# Patient Record
Sex: Female | Born: 2000 | Race: White | Hispanic: No | Marital: Single | Smoking: Never smoker
Health system: Southern US, Community
[De-identification: ages and names within clinical notes are randomized; demographics above are authoritative.]

## PROBLEM LIST (undated history)

## (undated) VITALS — BP 94/65 | HR 128 | Temp 98.4°F | Resp 16 | Ht 60.63 in | Wt 92.4 lb

## (undated) DIAGNOSIS — R058 Other specified cough: Secondary | ICD-10-CM

## (undated) DIAGNOSIS — M791 Myalgia, unspecified site: Secondary | ICD-10-CM

## (undated) DIAGNOSIS — R634 Abnormal weight loss: Secondary | ICD-10-CM

## (undated) DIAGNOSIS — Z87438 Personal history of other diseases of male genital organs: Secondary | ICD-10-CM

## (undated) DIAGNOSIS — R05 Cough: Secondary | ICD-10-CM

## (undated) DIAGNOSIS — R233 Spontaneous ecchymoses: Secondary | ICD-10-CM

## (undated) DIAGNOSIS — F1990 Other psychoactive substance use, unspecified, uncomplicated: Secondary | ICD-10-CM

## (undated) DIAGNOSIS — F32A Depression, unspecified: Secondary | ICD-10-CM

## (undated) DIAGNOSIS — F909 Attention-deficit hyperactivity disorder, unspecified type: Secondary | ICD-10-CM

## (undated) DIAGNOSIS — L299 Pruritus, unspecified: Secondary | ICD-10-CM

## (undated) DIAGNOSIS — Z8742 Personal history of other diseases of the female genital tract: Secondary | ICD-10-CM

## (undated) DIAGNOSIS — R5383 Other fatigue: Secondary | ICD-10-CM

## (undated) DIAGNOSIS — M2550 Pain in unspecified joint: Secondary | ICD-10-CM

## (undated) DIAGNOSIS — R000 Tachycardia, unspecified: Secondary | ICD-10-CM

## (undated) DIAGNOSIS — R21 Rash and other nonspecific skin eruption: Secondary | ICD-10-CM

## (undated) DIAGNOSIS — R238 Other skin changes: Secondary | ICD-10-CM

## (undated) DIAGNOSIS — F419 Anxiety disorder, unspecified: Secondary | ICD-10-CM

## (undated) DIAGNOSIS — F329 Major depressive disorder, single episode, unspecified: Secondary | ICD-10-CM

## (undated) HISTORY — PX: NO PAST SURGERIES: SHX2092

## (undated) HISTORY — DX: Rash and other nonspecific skin eruption: R21

## (undated) HISTORY — DX: Other psychoactive substance use, unspecified, uncomplicated: F19.90

## (undated) HISTORY — DX: Other fatigue: R53.83

## (undated) HISTORY — DX: Tachycardia, unspecified: R00.0

## (undated) HISTORY — DX: Pruritus, unspecified: L29.9

## (undated) HISTORY — DX: Pain in unspecified joint: M25.50

## (undated) HISTORY — DX: Other skin changes: R23.8

## (undated) HISTORY — DX: Major depressive disorder, single episode, unspecified: F32.9

## (undated) HISTORY — DX: Depression, unspecified: F32.A

## (undated) HISTORY — DX: Spontaneous ecchymoses: R23.3

## (undated) HISTORY — DX: Personal history of other diseases of the female genital tract: Z87.438

## (undated) HISTORY — DX: Anxiety disorder, unspecified: F41.9

## (undated) HISTORY — DX: Myalgia, unspecified site: M79.10

## (undated) HISTORY — DX: Abnormal weight loss: R63.4

## (undated) HISTORY — DX: Attention-deficit hyperactivity disorder, unspecified type: F90.9

## (undated) HISTORY — DX: Other specified cough: R05.8

## (undated) HISTORY — DX: Cough: R05

## (undated) HISTORY — DX: Personal history of other diseases of the female genital tract: Z87.42

---
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
## 2019-01-14 DIAGNOSIS — B081 Molluscum contagiosum: Secondary | ICD-10-CM | POA: Diagnosis not present

## 2019-01-22 DIAGNOSIS — B081 Molluscum contagiosum: Secondary | ICD-10-CM | POA: Diagnosis not present

## 2019-02-16 ENCOUNTER — Encounter: Payer: Self-pay | Admitting: Physician Assistant

## 2019-02-17 ENCOUNTER — Encounter: Payer: Self-pay | Admitting: Physician Assistant

## 2019-02-18 ENCOUNTER — Telehealth (INDEPENDENT_AMBULATORY_CARE_PROVIDER_SITE_OTHER): Payer: BLUE CROSS/BLUE SHIELD | Admitting: Family Medicine

## 2019-02-18 ENCOUNTER — Ambulatory Visit: Payer: BLUE CROSS/BLUE SHIELD | Admitting: Physician Assistant

## 2019-02-18 ENCOUNTER — Encounter: Payer: Self-pay | Admitting: Family Medicine

## 2019-02-18 VITALS — Temp 98.7°F | Wt 105.9 lb

## 2019-02-18 DIAGNOSIS — N3000 Acute cystitis without hematuria: Secondary | ICD-10-CM

## 2019-02-18 MED ORDER — CEPHALEXIN 500 MG PO CAPS: 500.0000 mg | ORAL_CAPSULE | Freq: Two times a day (BID) | ORAL | 0 refills | Status: DC

## 2019-02-18 NOTE — Progress Notes (Signed)
Virtual Visit via Video Note  I connected with Sierra Mcdaniel on 02/18/19 at  1:40 PM EDT by a video enabled telemedicine application and verified that I am speaking with the correct person using two identifiers.   I discussed the limitations of evaluation and management by telemedicine and the availability of in person appointments. The patient expressed understanding and agreed to proceed.  Pt was at home and I was in my office for the virtual visit.     Subjective:    CC: Urinary sxs  HPI: Having some dysuria x 3-4 days. Now she is seeing blood. She has been trying to drink a ton of water. Getting intermittent pelvic pain/pressure. No fever, or chils or N/V. Some mild intermittent low back pain, worse on the right. No vaginal sxs like d/c or itching. No recent UTI. Taking AZO. No recent antibiotics.     Past medical history, Surgical history, Family history not pertinant except as noted below, Social history, Allergies, and medications have been entered into the medical record, reviewed, and corrections made.   Review of Systems: No fevers, chills, night sweats, weight loss, chest pain, or shortness of breath.   Objective:    General: Speaking clearly in complete sentences without any shortness of breath.  Alert and oriented x3.  Normal judgment. No apparent acute distress.    Impression and Recommendations:    UTI -is likely urinary tract infection based on current symptoms.  We will go ahead and treat with Keflex.  If not improving over the next couple days then please let us know we will have her come in and do a urinalysis and culture for further work-up at that point.  She denies any vaginal symptoms currently.  Make sure to stay hydrated.  Okay to use the Azo.  Call back if develops any new symptoms or fever.      I discussed the assessment and treatment plan with the patient. The patient was provided an opportunity to ask questions and all were answered. The patient agreed  with the plan and demonstrated an understanding of the instructions.   The patient was advised to call back or seek an in-person evaluation if the symptoms worsen or if the condition fails to improve as anticipated.   Beatrice Lecher, MD

## 2019-02-19 ENCOUNTER — Telehealth: Payer: BLUE CROSS/BLUE SHIELD | Admitting: Family Medicine

## 2019-03-01 DIAGNOSIS — Z20828 Contact with and (suspected) exposure to other viral communicable diseases: Secondary | ICD-10-CM | POA: Diagnosis not present

## 2019-04-28 DIAGNOSIS — B081 Molluscum contagiosum: Secondary | ICD-10-CM | POA: Diagnosis not present

## 2019-04-28 DIAGNOSIS — Z3040 Encounter for surveillance of contraceptives, unspecified: Secondary | ICD-10-CM | POA: Diagnosis not present

## 2019-05-12 DIAGNOSIS — R0981 Nasal congestion: Secondary | ICD-10-CM | POA: Diagnosis not present

## 2019-05-12 DIAGNOSIS — R05 Cough: Secondary | ICD-10-CM | POA: Diagnosis not present

## 2019-07-14 DIAGNOSIS — Z3041 Encounter for surveillance of contraceptive pills: Secondary | ICD-10-CM | POA: Diagnosis not present

## 2019-07-14 DIAGNOSIS — N9089 Other specified noninflammatory disorders of vulva and perineum: Secondary | ICD-10-CM | POA: Diagnosis not present

## 2019-07-14 DIAGNOSIS — Z681 Body mass index (BMI) 19 or less, adult: Secondary | ICD-10-CM | POA: Diagnosis not present

## 2019-07-14 DIAGNOSIS — Z113 Encounter for screening for infections with a predominantly sexual mode of transmission: Secondary | ICD-10-CM | POA: Diagnosis not present

## 2019-07-14 DIAGNOSIS — B081 Molluscum contagiosum: Secondary | ICD-10-CM | POA: Diagnosis not present

## 2019-07-14 DIAGNOSIS — Z01419 Encounter for gynecological examination (general) (routine) without abnormal findings: Secondary | ICD-10-CM | POA: Diagnosis not present

## 2019-07-21 ENCOUNTER — Ambulatory Visit (INDEPENDENT_AMBULATORY_CARE_PROVIDER_SITE_OTHER): Payer: BLUE CROSS/BLUE SHIELD | Admitting: Physician Assistant

## 2019-07-21 ENCOUNTER — Encounter: Payer: Self-pay | Admitting: Physician Assistant

## 2019-07-21 VITALS — BP 115/70 | HR 97 | Temp 98.3°F | Ht 60.5 in | Wt 107.0 lb

## 2019-07-21 DIAGNOSIS — F909 Attention-deficit hyperactivity disorder, unspecified type: Secondary | ICD-10-CM

## 2019-07-21 DIAGNOSIS — F3181 Bipolar II disorder: Secondary | ICD-10-CM

## 2019-07-21 DIAGNOSIS — F39 Unspecified mood [affective] disorder: Secondary | ICD-10-CM | POA: Diagnosis not present

## 2019-07-21 MED ORDER — OLANZAPINE-FLUOXETINE HCL 3-25 MG PO CAPS: 1.0000 | ORAL_CAPSULE | Freq: Every evening | ORAL | 1 refills | Status: DC

## 2019-07-21 NOTE — Progress Notes (Signed)
Wants to discuss mood. PHQ9-GAD7 completed.   

## 2019-07-21 NOTE — Progress Notes (Signed)
Patient ID: Sierra Mcdaniel, female   DOB: [birth year], 18 y.o.   MRN: [id number] .Marland KitchenVirtual Visit via Video Note  I connected with Sierra Mcdaniel on 07/21/19 at  2:20 PM EST by a video enabled telemedicine application and verified that I am speaking with the correct person using two identifiers.  Location: Patient: home Provider: clinic   I discussed the limitations of evaluation and management by telemedicine and the availability of in person appointments. The patient expressed understanding and agreed to proceed.  History of Present Illness: Patient is a 18 year old female with history of bipolar 2 disorder who calls into the clinic to discuss medications.  She has been off medications for little over a year.  Over the last few months she is noticing a significant mood decline.  She is feeling very down and depressed.  She denies any suicidal ideations or homicidal thoughts.  She did lose her job recently and almost in denial about not being able to find any work.  She feels a lot of life stress right now.  She is struggling with no motivation.  In the past years she has been on medication such as Lamictal, Zoloft, trazodone, Vyvanse.  She does want to start something.  She admits she is not exercising.  She has gone to counseling in the past.  She has gone to counseling since she was very very young.  It does not seem to really make a difference.  .. Active Ambulatory Problems    Diagnosis Date Noted  . ADHD (attention deficit hyperactivity disorder) 08/30/2011  . Mood disorder (HCC) 08/30/2011  . Bipolar 2 disorder (HCC) 03/12/2018  . Arthralgia 03/17/2018  . Cold intolerance 03/17/2018  . Vaginal itching 03/17/2018  . Neck mass 03/17/2018  . Dysuria 03/17/2018  . Hematuria 03/17/2018   Resolved Ambulatory Problems    Diagnosis Date Noted  . Vaginal discharge 03/17/2018   Past Medical History:  Diagnosis Date  . Anxiety   . Depression   . Depression   . Drug use   . Dry cough   .  Easy bruising   . Fatigue   . History of genital discharge as a child   . Itching   . Joint pain   . Muscle pain   . Racing heart beat   . Rash of genital area   . Unintended weight loss    Reviewed med, allergy, problem list.    Observations/Objective: No acute distress. Normal breathing.  Flat mood.   .. Depression screen [facility] 2/9 07/21/2019 03/12/2018  Decreased Interest 3 0  Down, Depressed, Hopeless 3 0  PHQ - 2 Score 6 0  Altered sleeping 1 0  Tired, decreased energy 3 3  Change in appetite 2 0  Feeling bad or failure about yourself  2 0  Trouble concentrating 3 1  Moving slowly or fidgety/restless 3 0  Suicidal thoughts 0 0  PHQ-9 Score 20 4  Difficult doing work/chores Very difficult Not difficult at all   .Marland Kitchen GAD 7 : Generalized Anxiety Score 07/21/2019  Nervous, Anxious, on Edge 3  Control/stop worrying 3  Worry too much - different things 3  Trouble relaxing 2  Restless 0  Easily annoyed or irritable 3  Afraid - awful might happen 3  Total GAD 7 Score 17  Anxiety Difficulty Somewhat difficult     Assessment and Plan: Marland KitchenMarland KitchenJanequa was seen today for depression.  Diagnoses and all orders for this visit:  Bipolar 2 disorder (HCC) -  OLANZapine-FLUoxetine (SYMBYAX) 3-25 MG capsule; Take 1 capsule by mouth every evening.  Mood disorder (HCC)  Attention deficit hyperactivity disorder (ADHD), unspecified ADHD type   PHQ 9 and GAD 6 were both positive.  I do feel like patient would benefit from a mood stabilizer and an SSRI.  We will start Symbyax.  Discussed side effects with patient.  We will follow-up in 4 to 6 weeks.  Call office with any worsening of mood.  Encourage meditation, exercise, and self-care.   Follow Up Instructions:    I discussed the assessment and treatment plan with the patient. The patient was provided an opportunity to ask questions and all were answered. The patient agreed with the plan and demonstrated an understanding of the  instructions.   The patient was advised to call back or seek an in-person evaluation if the symptoms worsen or if the condition fails to improve as anticipated.   Iran Planas, PA-C

## 2019-08-03 DIAGNOSIS — Z113 Encounter for screening for infections with a predominantly sexual mode of transmission: Secondary | ICD-10-CM | POA: Diagnosis not present

## 2019-08-03 DIAGNOSIS — A749 Chlamydial infection, unspecified: Secondary | ICD-10-CM | POA: Diagnosis not present

## 2019-10-14 DIAGNOSIS — A749 Chlamydial infection, unspecified: Secondary | ICD-10-CM | POA: Diagnosis not present

## 2019-10-14 DIAGNOSIS — A599 Trichomoniasis, unspecified: Secondary | ICD-10-CM | POA: Diagnosis not present

## 2019-10-14 DIAGNOSIS — Z113 Encounter for screening for infections with a predominantly sexual mode of transmission: Secondary | ICD-10-CM | POA: Diagnosis not present

## 2019-10-21 DIAGNOSIS — Z03818 Encounter for observation for suspected exposure to other biological agents ruled out: Secondary | ICD-10-CM | POA: Diagnosis not present

## 2019-10-21 DIAGNOSIS — Z20828 Contact with and (suspected) exposure to other viral communicable diseases: Secondary | ICD-10-CM | POA: Diagnosis not present

## 2019-10-21 DIAGNOSIS — U071 COVID-19: Secondary | ICD-10-CM | POA: Diagnosis not present

## 2019-11-01 DIAGNOSIS — Z20828 Contact with and (suspected) exposure to other viral communicable diseases: Secondary | ICD-10-CM | POA: Diagnosis not present

## 2019-11-01 DIAGNOSIS — Z03818 Encounter for observation for suspected exposure to other biological agents ruled out: Secondary | ICD-10-CM | POA: Diagnosis not present

## 2019-12-23 DIAGNOSIS — Z20828 Contact with and (suspected) exposure to other viral communicable diseases: Secondary | ICD-10-CM | POA: Diagnosis not present

## 2019-12-23 DIAGNOSIS — Z03818 Encounter for observation for suspected exposure to other biological agents ruled out: Secondary | ICD-10-CM | POA: Diagnosis not present

## 2020-01-12 ENCOUNTER — Encounter: Payer: Self-pay | Admitting: Physician Assistant

## 2020-01-19 ENCOUNTER — Ambulatory Visit (INDEPENDENT_AMBULATORY_CARE_PROVIDER_SITE_OTHER): Payer: BC Managed Care – PPO | Admitting: Sports Medicine

## 2020-01-19 ENCOUNTER — Ambulatory Visit (INDEPENDENT_AMBULATORY_CARE_PROVIDER_SITE_OTHER): Payer: BC Managed Care – PPO

## 2020-01-19 ENCOUNTER — Encounter: Payer: Self-pay | Admitting: Sports Medicine

## 2020-01-19 DIAGNOSIS — M25532 Pain in left wrist: Secondary | ICD-10-CM

## 2020-01-19 DIAGNOSIS — M357 Hypermobility syndrome: Secondary | ICD-10-CM | POA: Diagnosis not present

## 2020-01-19 DIAGNOSIS — M25561 Pain in right knee: Secondary | ICD-10-CM | POA: Diagnosis not present

## 2020-01-19 DIAGNOSIS — M25562 Pain in left knee: Secondary | ICD-10-CM | POA: Diagnosis not present

## 2020-01-19 MED ORDER — MELOXICAM 15 MG PO TABS: ORAL_TABLET | ORAL | 3 refills | Status: DC

## 2020-01-19 NOTE — Progress Notes (Signed)
    Procedures performed today:    None.  Independent interpretation of notes and tests performed by another provider:   None.  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 She has a long history of pain in her right knee, both posterior and anterior, possibly worse with going up and down stairs, on exam her knee is stable but she does have some discomfort with terminal extension. We are going to start with patellofemoral rehabilitation exercises, meloxicam, x-rays, return to see me in 1 month, MRI if no better.  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 If failure of aggressive treatment of her orthopedic conditions we will consider testing for Ehlers-Danlos syndrome.  Left wrist pain Unclear etiology, pain with terminal extension, she does localize her pain over the volar wrist, all specialized exam maneuvers were unremarkable. 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    ___________________________________________ Ihor Austin. Benjamin Stain, M.D., ABFM., CAQSM. Primary Care and Sports Medicine [facility]  Adjunct Instructor of Family Medicine  [employer]

## 2020-01-19 NOTE — Assessment & Plan Note (Signed)
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 If failure of aggressive treatment of her orthopedic conditions we will consider testing for Ehlers-Danlos syndrome.

## 2020-01-19 NOTE — Assessment & Plan Note (Signed)
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 She has a long history of pain in her right knee, both posterior and anterior, possibly worse with going up and down stairs, on exam her knee is stable but she does have some discomfort with terminal extension. We are going to start with patellofemoral rehabilitation exercises, meloxicam, x-rays, return to see me in 1 month, MRI if no better.

## 2020-01-19 NOTE — Assessment & Plan Note (Signed)
Unclear etiology, pain with terminal extension, she does localize her pain over the volar wrist, all specialized exam maneuvers were unremarkable. 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

## 2020-01-29 ENCOUNTER — Encounter: Payer: Self-pay | Admitting: Physician Assistant

## 2020-01-29 DIAGNOSIS — F3181 Bipolar II disorder: Secondary | ICD-10-CM

## 2020-01-29 MED ORDER — OLANZAPINE-FLUOXETINE HCL 3-25 MG PO CAPS: 1.0000 | ORAL_CAPSULE | Freq: Every evening | ORAL | 0 refills | Status: DC

## 2020-02-16 ENCOUNTER — Ambulatory Visit (INDEPENDENT_AMBULATORY_CARE_PROVIDER_SITE_OTHER): Payer: Self-pay | Admitting: Physician Assistant

## 2020-02-16 DIAGNOSIS — Z5329 Procedure and treatment not carried out because of patient's decision for other reasons: Secondary | ICD-10-CM

## 2020-02-16 NOTE — Progress Notes (Signed)
No show

## 2020-02-28 ENCOUNTER — Encounter: Payer: Self-pay | Admitting: Physician Assistant

## 2020-03-01 DIAGNOSIS — Z03818 Encounter for observation for suspected exposure to other biological agents ruled out: Secondary | ICD-10-CM | POA: Diagnosis not present

## 2020-03-01 DIAGNOSIS — Z20822 Contact with and (suspected) exposure to covid-19: Secondary | ICD-10-CM | POA: Diagnosis not present

## 2020-03-02 ENCOUNTER — Ambulatory Visit (INDEPENDENT_AMBULATORY_CARE_PROVIDER_SITE_OTHER): Payer: BC Managed Care – PPO | Admitting: Physician Assistant

## 2020-03-02 VITALS — BP 108/60 | HR 91 | Temp 98.4°F | Ht 60.5 in | Wt 106.0 lb

## 2020-03-02 DIAGNOSIS — Z23 Encounter for immunization: Secondary | ICD-10-CM | POA: Diagnosis not present

## 2020-03-02 DIAGNOSIS — R300 Dysuria: Secondary | ICD-10-CM | POA: Diagnosis not present

## 2020-03-02 DIAGNOSIS — F3181 Bipolar II disorder: Secondary | ICD-10-CM

## 2020-03-02 DIAGNOSIS — Z3041 Encounter for surveillance of contraceptive pills: Secondary | ICD-10-CM

## 2020-03-02 DIAGNOSIS — Z113 Encounter for screening for infections with a predominantly sexual mode of transmission: Secondary | ICD-10-CM

## 2020-03-02 LAB — POCT URINALYSIS DIP (CLINITEK)
Bilirubin, UA: NEGATIVE
Blood, UA: NEGATIVE
Glucose, UA: NEGATIVE mg/dL
Ketones, POC UA: NEGATIVE mg/dL
Leukocytes, UA: NEGATIVE
Nitrite, UA: NEGATIVE
POC PROTEIN,UA: NEGATIVE
Spec Grav, UA: 1.01 (ref 1.010–1.025)
Urobilinogen, UA: 0.2 E.U./dL
pH, UA: 7 (ref 5.0–8.0)

## 2020-03-02 MED ORDER — NORGESTIM-ETH ESTRAD TRIPHASIC 0.18/0.215/0.25 MG-35 MCG PO TABS: ORAL_TABLET | ORAL | 3 refills | Status: DC

## 2020-03-02 MED ORDER — PHENAZOPYRIDINE HCL 200 MG PO TABS: 200.0000 mg | ORAL_TABLET | Freq: Three times a day (TID) | ORAL | 0 refills | Status: AC

## 2020-03-02 MED ORDER — OLANZAPINE-FLUOXETINE HCL 3-25 MG PO CAPS: 1.0000 | ORAL_CAPSULE | Freq: Every evening | ORAL | 3 refills | Status: DC

## 2020-03-02 NOTE — Progress Notes (Signed)
Subjective:    Patient ID: Sierra Mcdaniel, female    DOB: [birth year], 19 y.o.   MRN: [id number]  HPI Pt is a 19 yo female with ADHD, Bipolar 2 who presents to the clinic with 3 days of burning with urination.   Pt is sexually active and does worry her about STI's. She denies any vaginal discharge or odor. She denies any abdominal pain, flank pain, fever, chills, nausea or vomiting. She has hx of BV in past as well. She just wants everything checked out. Not taking anything to make symptoms better. She is on OCP and needs refill.   Her mood is MUCH better per pt on symbax. She is tolerating well. No problems or concerns. No SI/HC.   Marland Kitchen. Active Ambulatory Problems    Diagnosis Date Noted  . ADHD (attention deficit hyperactivity disorder) 08/30/2011  . Mood disorder (HCC) 08/30/2011  . Bipolar 2 disorder (HCC) 03/12/2018  . Arthralgia 03/17/2018  . Cold intolerance 03/17/2018  . Vaginal itching 03/17/2018  . Neck mass 03/17/2018  . Dysuria 03/17/2018  . Hematuria 03/17/2018  . Left wrist pain 01/19/2020  . Right knee pain 01/19/2020  . 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 Vaginal discharge 03/17/2018   Past Medical History:  Diagnosis Date  . Anxiety   . Depression   . Depression   . Drug use   . Dry cough   . Easy bruising   . Fatigue   . History of genital discharge as a child   . Itching   . Joint pain   . Muscle pain   . Racing heart beat   . Rash of genital area   . Unintended weight loss         Review of Systems    see HPI.  Objective:   Physical Exam Vitals reviewed.  Constitutional:      Appearance: Normal appearance.  HENT:     Head: Normocephalic.     Right Ear: Tympanic membrane normal.     Left Ear: Tympanic membrane normal.  Cardiovascular:     Rate and Rhythm: Normal rate and regular rhythm.     Pulses: Normal pulses.  Pulmonary:     Effort: Pulmonary effort is normal.   Abdominal:     General: There is no distension.     Palpations: Abdomen is soft.     Tenderness: There is no abdominal tenderness. There is no right CVA tenderness or guarding.  Neurological:     Mental Status: She is alert and oriented to person, place, and time.  Psychiatric:        Mood and Affect: Mood normal.      .. Depression screen [facility] 2/9 03/02/2020 07/21/2019 03/12/2018  Decreased Interest 0 3 0  Down, Depressed, Hopeless 3 3 0  PHQ - 2 Score 3 6 0  Altered sleeping 3 1 0  Tired, decreased energy 3 3 3   Change in appetite 3 2 0  Feeling bad or failure about yourself  0 2 0  Trouble concentrating 3 3 1   Moving slowly or fidgety/restless 0 3 0  Suicidal thoughts 0 0 0  PHQ-9 Score 15 20 4   Difficult doing work/chores Somewhat difficult Very difficult Not difficult at all   . GAD 7 : Generalized Anxiety Score 03/02/2020 07/21/2019  Nervous, Anxious, on Edge 3 3  Control/stop worrying 1 3  Worry too much - different things 2 3  Trouble relaxing 0 2  Restless 0 0  Easily annoyed or irritable 1 3  Afraid - awful might happen 0 3  Total GAD 7 Score 7 17  Anxiety Difficulty Somewhat difficult Somewhat difficult   .Marland Kitchen Results for orders placed or performed in visit on 03/02/20  C. trachomatis/N. gonorrhoeae RNA   Specimen: Urine  Result Value Ref Range   C. trachomatis RNA, TMA NOT DETECTED NOT DETECT   N. gonorrhoeae RNA, TMA NOT DETECTED NOT DETECT  Urine Culture   Specimen: Urine  Result Value Ref Range   MICRO NUMBER: [id number]    SPECIMEN QUALITY: Adequate    Sample Source URINE    STATUS: FINAL    ISOLATE 1: Staphylococcus saprophyticus (A)   WET PREP FOR TRICH, YEAST, CLUE   Specimen: GYN  Result Value Ref Range   MICRO NUMBER: [id number]    Specimen Quality Adequate    SOURCE: NOT GIVEN    Status FINAL    RESULT yeast (A)   POCT URINALYSIS DIP (CLINITEK)  Result Value Ref Range   Color, UA yellow yellow   Clarity, UA clear clear   Glucose, UA  negative negative mg/dL   Bilirubin, UA negative negative   Ketones, POC UA negative negative mg/dL   Spec Grav, UA 3.846 6.599 - 1.025   Blood, UA negative negative   pH, UA 7.0 5.0 - 8.0   POC PROTEIN,UA negative negative, trace   Urobilinogen, UA 0.2 0.2 or 1.0 E.U./dL   Nitrite, UA Negative Negative   Leukocytes, UA Negative Negative         Assessment & Plan:  Marland KitchenMarland KitchenLidie was seen today for follow-up.  Diagnoses and all orders for this visit:  Dysuria -     POCT URINALYSIS DIP (CLINITEK) -     Urine Culture -     phenazopyridine (PYRIDIUM) 200 MG tablet; Take 1 tablet (200 mg total) by mouth 3 (three) times daily for 2 days. -     WET PREP FOR TRICH, YEAST, CLUE  Screen for STD (sexually transmitted disease) -     C. trachomatis/N. gonorrhoeae RNA  Need for Tdap vaccination -     Tdap vaccine greater than or equal to 7yo IM  Bipolar 2 disorder (HCC) -     OLANZapine-FLUoxetine (SYMBYAX) 3-25 MG capsule; Take 1 capsule by mouth every evening.  Encounter for surveillance of contraceptive pills -     Norgestimate-Ethinyl Estradiol Triphasic (TRI-SPRINTEC) 0.18/0.215/0.25 MG-35 MCG tablet; Tri-Sprintec (28) 0.18 mg(7)/0.215 mg(7)/0.25 mg(7)-35 mcg tablet  TAKE 1 TABLET BY MOUTH ONCE DAILY   UA no clear signs of infection. Sent pyridium and sent for culture. Stay hydrated. Will call with results.   Wet prep ordered and showed yeast. Sent diflucan.   STI testing ordered. Under 21 OcP refilled for 1 year.   PHQ and GAD numbers much better continue symbyax. Follow up in 6 months.   Addendum: yeast-diflucan staph in culture-sent macrobid. Negative STIs.

## 2020-03-03 ENCOUNTER — Encounter: Payer: Self-pay | Admitting: Physician Assistant

## 2020-03-03 ENCOUNTER — Other Ambulatory Visit: Payer: Self-pay | Admitting: Physician Assistant

## 2020-03-03 LAB — C. TRACHOMATIS/N. GONORRHOEAE RNA
C. trachomatis RNA, TMA: NOT DETECTED
N. gonorrhoeae RNA, TMA: NOT DETECTED

## 2020-03-03 LAB — WET PREP FOR TRICH, YEAST, CLUE
MICRO NUMBER:: 10732538
Specimen Quality: ADEQUATE

## 2020-03-03 MED ORDER — FLUCONAZOLE 150 MG PO TABS
150.0000 mg | ORAL_TABLET | Freq: Once | ORAL | 0 refills | Status: AC
Start: 2020-03-03 — End: 2020-03-03

## 2020-03-03 NOTE — Progress Notes (Signed)
Yeast seen in wet prep. Will send over diflucan.

## 2020-03-04 ENCOUNTER — Ambulatory Visit: Payer: BC Managed Care – PPO | Admitting: Physician Assistant

## 2020-03-04 ENCOUNTER — Other Ambulatory Visit: Payer: Self-pay | Admitting: Physician Assistant

## 2020-03-04 ENCOUNTER — Encounter: Payer: Self-pay | Admitting: Physician Assistant

## 2020-03-04 LAB — URINE CULTURE
MICRO NUMBER:: 10734181
SPECIMEN QUALITY:: ADEQUATE

## 2020-03-04 MED ORDER — NITROFURANTOIN MONOHYD MACRO 100 MG PO CAPS
100.0000 mg | ORAL_CAPSULE | Freq: Two times a day (BID) | ORAL | 0 refills | Status: DC
Start: 2020-03-04 — End: 2022-01-18

## 2020-03-04 MED ORDER — FLUCONAZOLE 150 MG PO TABS
150.0000 mg | ORAL_TABLET | Freq: Once | ORAL | 0 refills | Status: AC
Start: 2020-03-04 — End: 2020-03-04

## 2020-03-04 NOTE — Progress Notes (Signed)
Urine culture did show staph, a bacteria, as well. Start antibiotic.

## 2020-03-24 DIAGNOSIS — Z20822 Contact with and (suspected) exposure to covid-19: Secondary | ICD-10-CM | POA: Diagnosis not present

## 2020-09-19 DIAGNOSIS — S62316A Displaced fracture of base of fifth metacarpal bone, right hand, initial encounter for closed fracture: Secondary | ICD-10-CM | POA: Diagnosis not present

## 2020-09-19 DIAGNOSIS — M79642 Pain in left hand: Secondary | ICD-10-CM | POA: Diagnosis not present

## 2020-09-19 DIAGNOSIS — J029 Acute pharyngitis, unspecified: Secondary | ICD-10-CM | POA: Diagnosis not present

## 2020-09-19 DIAGNOSIS — R059 Cough, unspecified: Secondary | ICD-10-CM | POA: Diagnosis not present

## 2020-09-19 DIAGNOSIS — M799 Soft tissue disorder, unspecified: Secondary | ICD-10-CM | POA: Diagnosis not present

## 2020-09-19 DIAGNOSIS — S60222A Contusion of left hand, initial encounter: Secondary | ICD-10-CM | POA: Diagnosis not present

## 2020-09-19 DIAGNOSIS — S6991XA Unspecified injury of right wrist, hand and finger(s), initial encounter: Secondary | ICD-10-CM | POA: Diagnosis not present

## 2020-09-19 DIAGNOSIS — F17210 Nicotine dependence, cigarettes, uncomplicated: Secondary | ICD-10-CM | POA: Diagnosis not present

## 2020-09-19 DIAGNOSIS — Z20822 Contact with and (suspected) exposure to covid-19: Secondary | ICD-10-CM | POA: Diagnosis not present

## 2020-09-19 DIAGNOSIS — S62336A Displaced fracture of neck of fifth metacarpal bone, right hand, initial encounter for closed fracture: Secondary | ICD-10-CM | POA: Diagnosis not present

## 2020-09-19 DIAGNOSIS — R0981 Nasal congestion: Secondary | ICD-10-CM | POA: Diagnosis not present

## 2020-09-19 DIAGNOSIS — S6992XA Unspecified injury of left wrist, hand and finger(s), initial encounter: Secondary | ICD-10-CM | POA: Diagnosis not present

## 2020-09-19 DIAGNOSIS — W19XXXA Unspecified fall, initial encounter: Secondary | ICD-10-CM | POA: Diagnosis not present

## 2020-09-22 DIAGNOSIS — S62316A Displaced fracture of base of fifth metacarpal bone, right hand, initial encounter for closed fracture: Secondary | ICD-10-CM | POA: Diagnosis not present

## 2020-10-13 DIAGNOSIS — S62326D Displaced fracture of shaft of fifth metacarpal bone, right hand, subsequent encounter for fracture with routine healing: Secondary | ICD-10-CM | POA: Diagnosis not present

## 2020-10-13 DIAGNOSIS — M79642 Pain in left hand: Secondary | ICD-10-CM | POA: Diagnosis not present

## 2020-10-13 DIAGNOSIS — W009XXD Unspecified fall due to ice and snow, subsequent encounter: Secondary | ICD-10-CM | POA: Diagnosis not present

## 2020-10-13 DIAGNOSIS — S62316D Displaced fracture of base of fifth metacarpal bone, right hand, subsequent encounter for fracture with routine healing: Secondary | ICD-10-CM | POA: Diagnosis not present

## 2020-11-21 DIAGNOSIS — N9089 Other specified noninflammatory disorders of vulva and perineum: Secondary | ICD-10-CM | POA: Diagnosis not present

## 2020-11-21 DIAGNOSIS — Z113 Encounter for screening for infections with a predominantly sexual mode of transmission: Secondary | ICD-10-CM | POA: Diagnosis not present

## 2020-11-21 DIAGNOSIS — N760 Acute vaginitis: Secondary | ICD-10-CM | POA: Diagnosis not present

## 2020-12-27 DIAGNOSIS — Z1331 Encounter for screening for depression: Secondary | ICD-10-CM | POA: Diagnosis not present

## 2020-12-27 DIAGNOSIS — F411 Generalized anxiety disorder: Secondary | ICD-10-CM | POA: Diagnosis not present

## 2020-12-27 DIAGNOSIS — Z1339 Encounter for screening examination for other mental health and behavioral disorders: Secondary | ICD-10-CM | POA: Diagnosis not present

## 2020-12-27 DIAGNOSIS — F331 Major depressive disorder, recurrent, moderate: Secondary | ICD-10-CM | POA: Diagnosis not present

## 2021-01-24 DIAGNOSIS — F411 Generalized anxiety disorder: Secondary | ICD-10-CM | POA: Diagnosis not present

## 2021-01-24 DIAGNOSIS — Z1331 Encounter for screening for depression: Secondary | ICD-10-CM | POA: Diagnosis not present

## 2021-01-24 DIAGNOSIS — F331 Major depressive disorder, recurrent, moderate: Secondary | ICD-10-CM | POA: Diagnosis not present

## 2021-02-23 DIAGNOSIS — F331 Major depressive disorder, recurrent, moderate: Secondary | ICD-10-CM | POA: Diagnosis not present

## 2021-02-23 DIAGNOSIS — F411 Generalized anxiety disorder: Secondary | ICD-10-CM | POA: Diagnosis not present

## 2021-04-18 DIAGNOSIS — X58XXXD Exposure to other specified factors, subsequent encounter: Secondary | ICD-10-CM | POA: Diagnosis not present

## 2021-04-18 DIAGNOSIS — S62326D Displaced fracture of shaft of fifth metacarpal bone, right hand, subsequent encounter for fracture with routine healing: Secondary | ICD-10-CM | POA: Diagnosis not present

## 2021-04-18 DIAGNOSIS — M79642 Pain in left hand: Secondary | ICD-10-CM | POA: Diagnosis not present

## 2021-05-01 DIAGNOSIS — F411 Generalized anxiety disorder: Secondary | ICD-10-CM | POA: Diagnosis not present

## 2021-05-01 DIAGNOSIS — F331 Major depressive disorder, recurrent, moderate: Secondary | ICD-10-CM | POA: Diagnosis not present

## 2021-05-18 DIAGNOSIS — F411 Generalized anxiety disorder: Secondary | ICD-10-CM | POA: Diagnosis not present

## 2021-05-18 DIAGNOSIS — F331 Major depressive disorder, recurrent, moderate: Secondary | ICD-10-CM | POA: Diagnosis not present

## 2021-05-18 DIAGNOSIS — Z1331 Encounter for screening for depression: Secondary | ICD-10-CM | POA: Diagnosis not present

## 2021-06-06 DIAGNOSIS — Z1331 Encounter for screening for depression: Secondary | ICD-10-CM | POA: Diagnosis not present

## 2021-06-06 DIAGNOSIS — F331 Major depressive disorder, recurrent, moderate: Secondary | ICD-10-CM | POA: Diagnosis not present

## 2021-06-06 DIAGNOSIS — F411 Generalized anxiety disorder: Secondary | ICD-10-CM | POA: Diagnosis not present

## 2021-06-07 DIAGNOSIS — Z01419 Encounter for gynecological examination (general) (routine) without abnormal findings: Secondary | ICD-10-CM | POA: Diagnosis not present

## 2021-06-07 DIAGNOSIS — N898 Other specified noninflammatory disorders of vagina: Secondary | ICD-10-CM | POA: Diagnosis not present

## 2021-06-07 DIAGNOSIS — Z6823 Body mass index (BMI) 23.0-23.9, adult: Secondary | ICD-10-CM | POA: Diagnosis not present

## 2021-06-07 DIAGNOSIS — Z113 Encounter for screening for infections with a predominantly sexual mode of transmission: Secondary | ICD-10-CM | POA: Diagnosis not present

## 2021-06-07 DIAGNOSIS — L659 Nonscarring hair loss, unspecified: Secondary | ICD-10-CM | POA: Diagnosis not present

## 2021-06-07 DIAGNOSIS — B081 Molluscum contagiosum: Secondary | ICD-10-CM | POA: Diagnosis not present

## 2021-06-07 DIAGNOSIS — R6882 Decreased libido: Secondary | ICD-10-CM | POA: Diagnosis not present

## 2021-06-08 LAB — HM PAP SMEAR: HM Pap smear: NEGATIVE

## 2021-07-10 DIAGNOSIS — Z1331 Encounter for screening for depression: Secondary | ICD-10-CM | POA: Diagnosis not present

## 2021-07-10 DIAGNOSIS — F411 Generalized anxiety disorder: Secondary | ICD-10-CM | POA: Diagnosis not present

## 2021-07-10 DIAGNOSIS — F331 Major depressive disorder, recurrent, moderate: Secondary | ICD-10-CM | POA: Diagnosis not present

## 2021-11-04 IMAGING — DX DG KNEE COMPLETE 4+V*R*
4 series · 4 of 4 positions shown · non-contrast
Comparison: None.

CLINICAL DATA: Pain

EXAM:
RIGHT KNEE - COMPLETE 4+ VIEW

[knee lat]
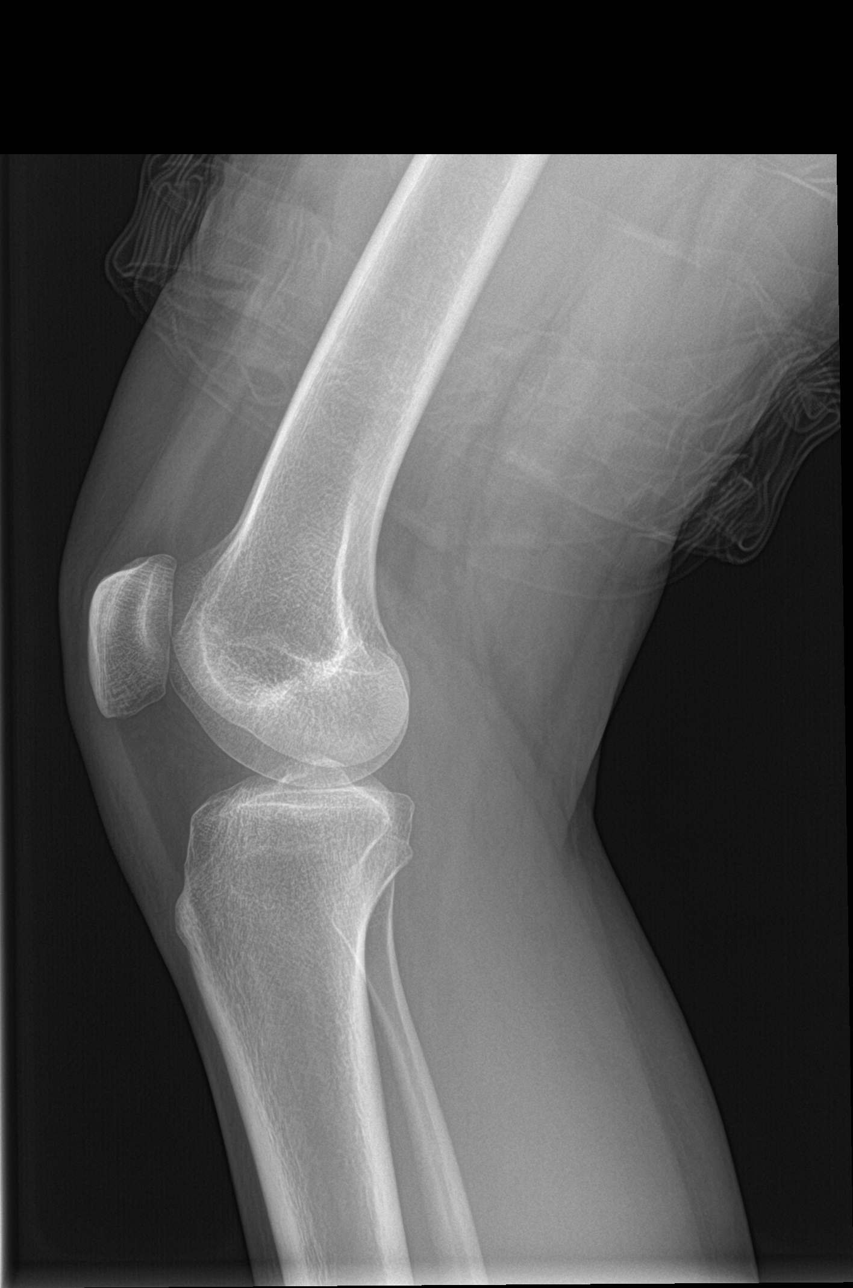

[knee sunrise]
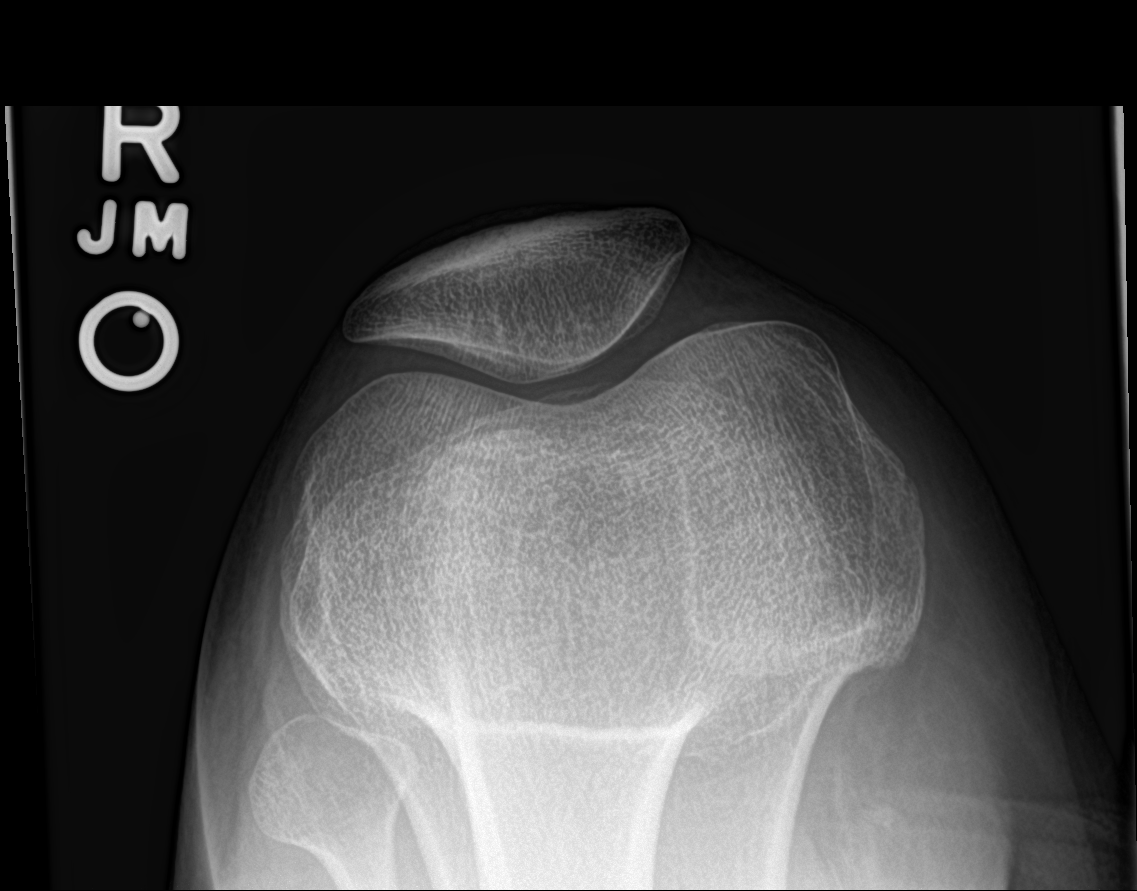

[knee ap bilat standing (1 of 2)]
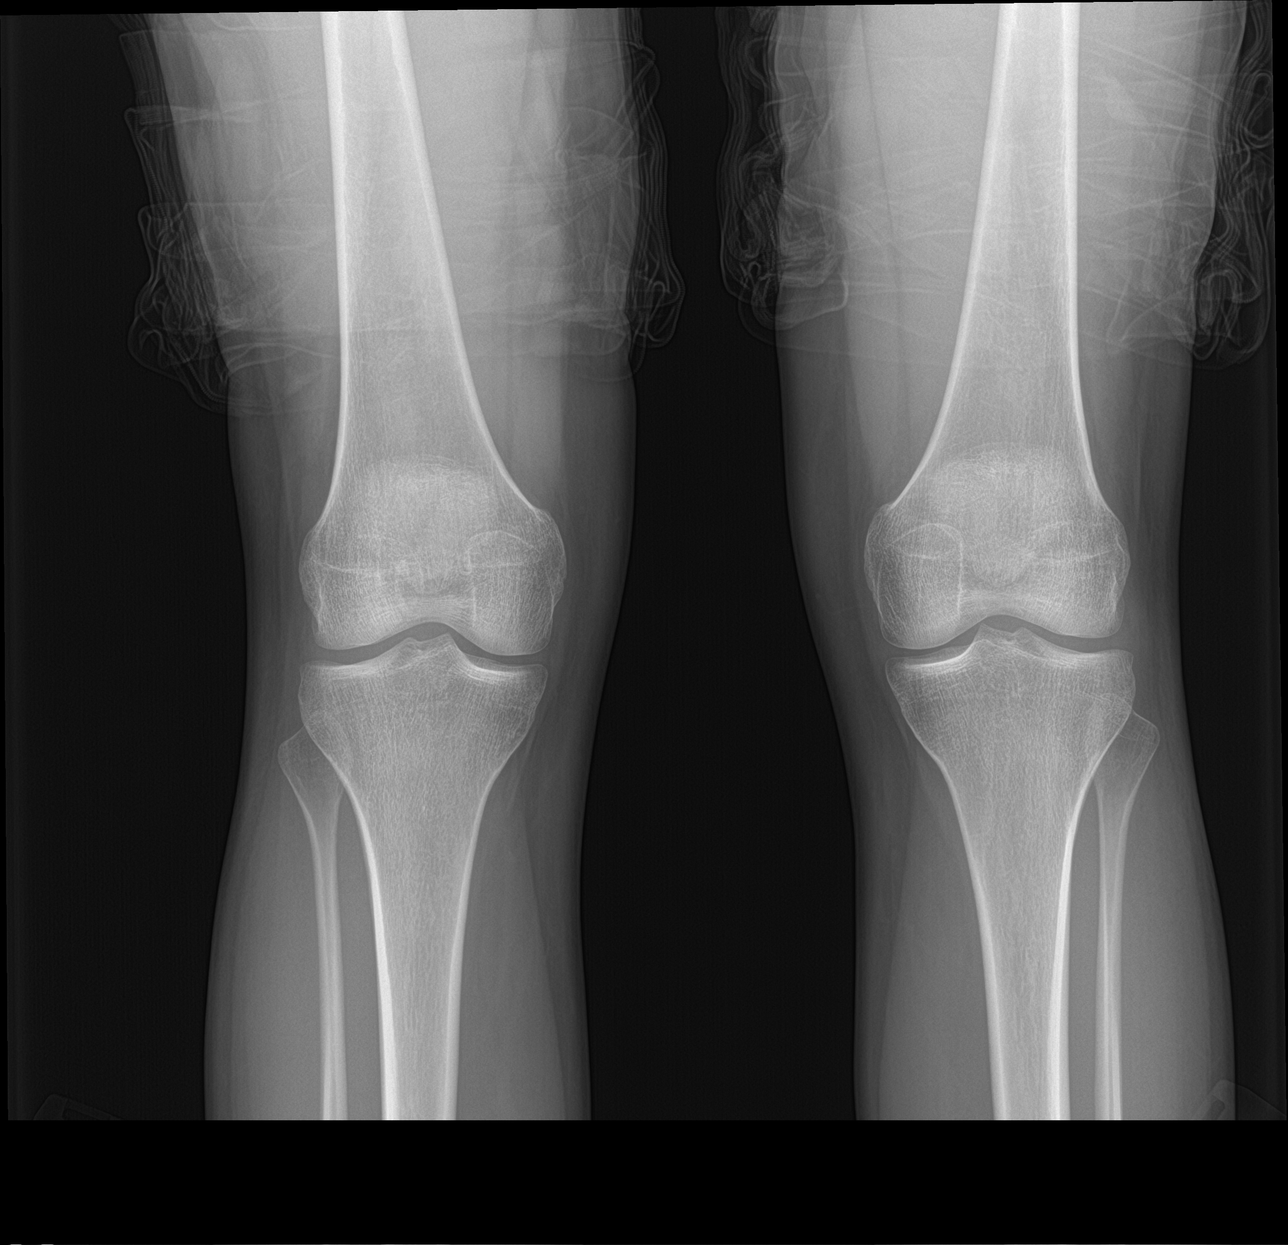

[knee ap bilat standing (2 of 2)]
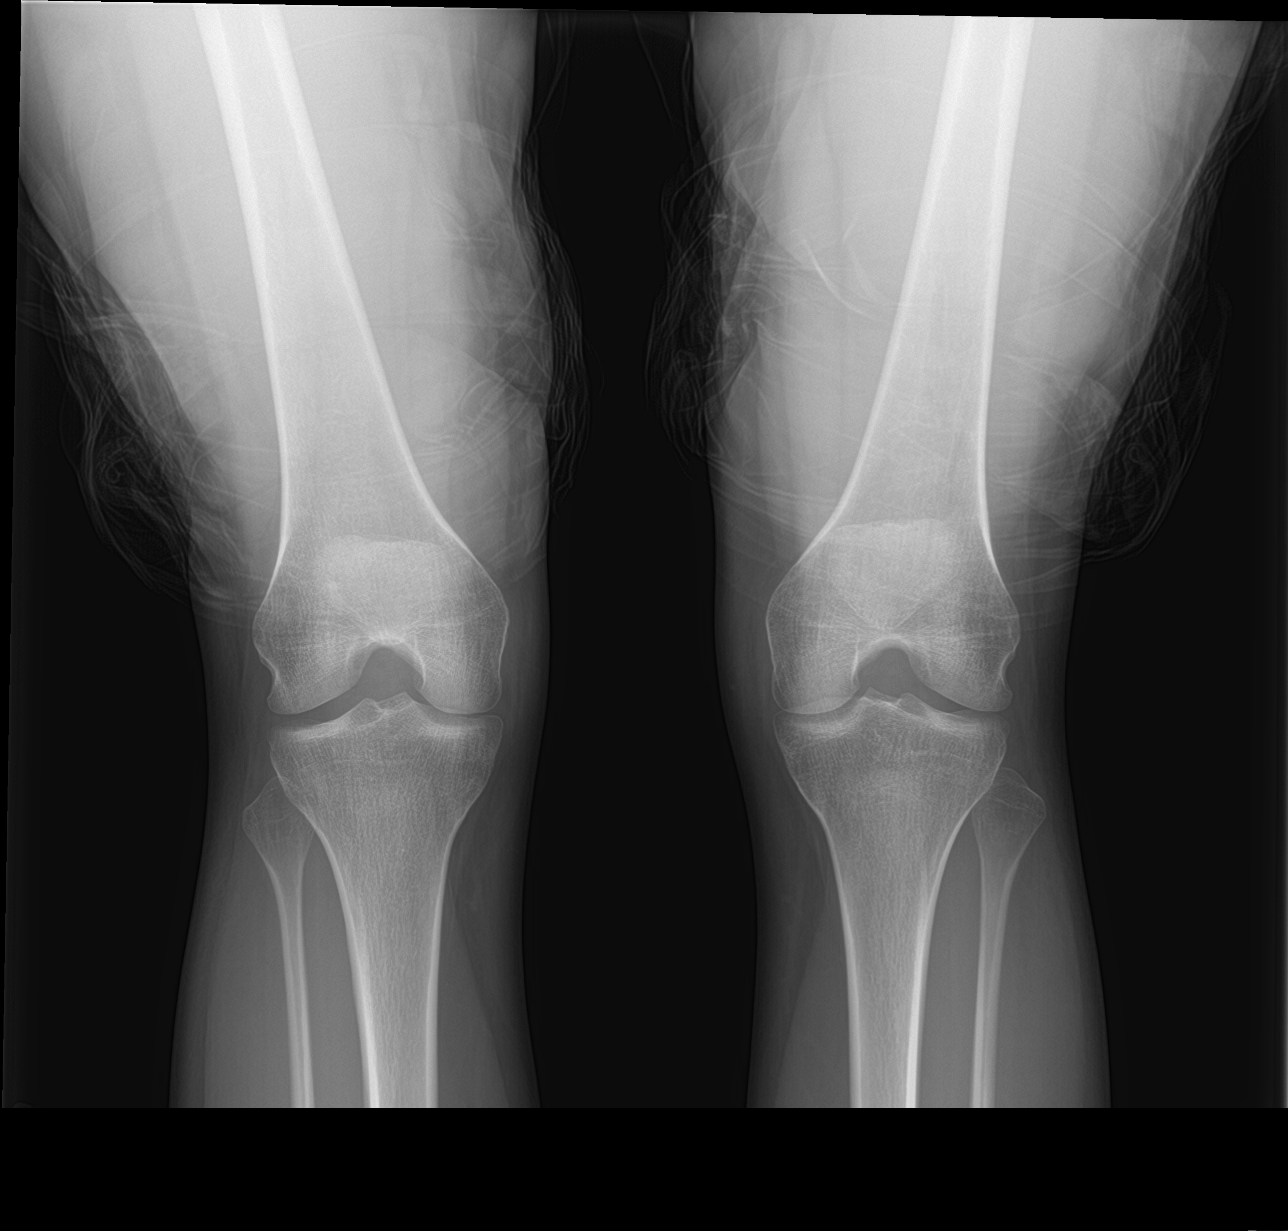

[4 of 4 positions shown; findings below may reference images not displayed]

FINDINGS: Standing frontal, standing tunnel, lateral, and sunrise patellar
images were obtained. No fracture or dislocation. No joint effusion.
The joint spaces appear unremarkable. No erosive change.
IMPRESSION: No fracture, dislocation, or joint effusion. No evident arthropathy.

## 2021-12-11 DIAGNOSIS — N760 Acute vaginitis: Secondary | ICD-10-CM | POA: Diagnosis not present

## 2021-12-11 DIAGNOSIS — Z113 Encounter for screening for infections with a predominantly sexual mode of transmission: Secondary | ICD-10-CM | POA: Diagnosis not present

## 2022-01-13 DIAGNOSIS — H109 Unspecified conjunctivitis: Secondary | ICD-10-CM | POA: Diagnosis not present

## 2022-01-13 DIAGNOSIS — L03213 Periorbital cellulitis: Secondary | ICD-10-CM | POA: Diagnosis not present

## 2022-01-17 DIAGNOSIS — Z113 Encounter for screening for infections with a predominantly sexual mode of transmission: Secondary | ICD-10-CM | POA: Diagnosis not present

## 2022-01-17 DIAGNOSIS — N760 Acute vaginitis: Secondary | ICD-10-CM | POA: Diagnosis not present

## 2022-01-17 DIAGNOSIS — L292 Pruritus vulvae: Secondary | ICD-10-CM | POA: Diagnosis not present

## 2022-01-19 ENCOUNTER — Encounter: Payer: Self-pay | Admitting: Physician Assistant

## 2022-01-19 ENCOUNTER — Ambulatory Visit: Payer: BC Managed Care – PPO | Admitting: Physician Assistant

## 2022-01-19 VITALS — BP 101/58 | HR 89 | Temp 97.9°F | Ht 60.5 in | Wt 113.0 lb

## 2022-01-19 DIAGNOSIS — N760 Acute vaginitis: Secondary | ICD-10-CM | POA: Diagnosis not present

## 2022-01-19 DIAGNOSIS — H109 Unspecified conjunctivitis: Secondary | ICD-10-CM | POA: Diagnosis not present

## 2022-01-19 DIAGNOSIS — L03213 Periorbital cellulitis: Secondary | ICD-10-CM | POA: Insufficient documentation

## 2022-01-19 NOTE — Progress Notes (Signed)
   Acute Office Visit  Subjective:     Patient ID: Sierra Mcdaniel, female    DOB: [birth year], 21 y.o.   MRN: [id number]  Chief Complaint  Patient presents with   Eye Problem    HPI Patient is in today to follow up on right eye preseptal cellulitis and bacterial conjunctivitis.   On 6/3 she went to UC because of right eye swelling, pain and discharge. Symptoms started 2 days after jumping in [place]. She was treated with polytrim drops and augmentin. She is doing much better but did get a yeast infection. She has about 4 more days of augmentin and wants to know if she should stay on it.   No symptoms today. No eye redness, discharge, pain, vision changes.   .. 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     Objective:     Right outer eye without redness, discharge, swelling.  Conjunctiva not injected.     Assessment & Plan:  Sierra Mcdaniel KitchenCanesha was seen today for eye problem.  Diagnoses and all orders for this visit:  Preseptal cellulitis of right eye  Bacterial  conjunctivitis of right eye  Acute vaginitis  Symptoms seemed to have resolved Pt STRONGLY recommended to complete all of her abx therapy to make sure infection does not come back Discussed yeast infections after abx She has another diflucan if needed Discussed probiotics while on abx and increasing yogurt Follow up as needed and if symptoms worsen.    Iran Planas, PA-C

## 2022-01-24 ENCOUNTER — Encounter: Payer: Self-pay | Admitting: Physician Assistant

## 2022-01-24 DIAGNOSIS — F3181 Bipolar II disorder: Secondary | ICD-10-CM

## 2022-01-26 MED ORDER — OLANZAPINE-FLUOXETINE HCL 3-25 MG PO CAPS: 1.0000 | ORAL_CAPSULE | Freq: Every evening | ORAL | 0 refills | Status: DC

## 2022-03-06 DIAGNOSIS — J029 Acute pharyngitis, unspecified: Secondary | ICD-10-CM | POA: Diagnosis not present

## 2022-03-06 DIAGNOSIS — U071 COVID-19: Secondary | ICD-10-CM | POA: Diagnosis not present

## 2022-03-14 ENCOUNTER — Encounter: Payer: Self-pay | Admitting: Neurology

## 2022-03-22 DIAGNOSIS — Z309 Encounter for contraceptive management, unspecified: Secondary | ICD-10-CM | POA: Diagnosis not present

## 2022-04-02 DIAGNOSIS — Z30430 Encounter for insertion of intrauterine contraceptive device: Secondary | ICD-10-CM | POA: Diagnosis not present

## 2022-04-02 DIAGNOSIS — Z113 Encounter for screening for infections with a predominantly sexual mode of transmission: Secondary | ICD-10-CM | POA: Diagnosis not present

## 2022-04-02 DIAGNOSIS — Z3202 Encounter for pregnancy test, result negative: Secondary | ICD-10-CM | POA: Diagnosis not present

## 2022-05-03 DIAGNOSIS — N9089 Other specified noninflammatory disorders of vulva and perineum: Secondary | ICD-10-CM | POA: Diagnosis not present

## 2022-05-03 DIAGNOSIS — N760 Acute vaginitis: Secondary | ICD-10-CM | POA: Diagnosis not present

## 2022-05-03 DIAGNOSIS — Z113 Encounter for screening for infections with a predominantly sexual mode of transmission: Secondary | ICD-10-CM | POA: Diagnosis not present

## 2022-05-03 DIAGNOSIS — A609 Anogenital herpesviral infection, unspecified: Secondary | ICD-10-CM | POA: Diagnosis not present

## 2022-05-10 DIAGNOSIS — H01002 Unspecified blepharitis right lower eyelid: Secondary | ICD-10-CM | POA: Diagnosis not present

## 2022-05-10 DIAGNOSIS — H00012 Hordeolum externum right lower eyelid: Secondary | ICD-10-CM | POA: Diagnosis not present

## 2022-05-30 DIAGNOSIS — Z20822 Contact with and (suspected) exposure to covid-19: Secondary | ICD-10-CM | POA: Diagnosis not present

## 2022-05-30 DIAGNOSIS — R0981 Nasal congestion: Secondary | ICD-10-CM | POA: Diagnosis not present

## 2022-05-30 DIAGNOSIS — R059 Cough, unspecified: Secondary | ICD-10-CM | POA: Diagnosis not present

## 2022-05-30 DIAGNOSIS — J029 Acute pharyngitis, unspecified: Secondary | ICD-10-CM | POA: Diagnosis not present

## 2022-08-15 DIAGNOSIS — Z01419 Encounter for gynecological examination (general) (routine) without abnormal findings: Secondary | ICD-10-CM | POA: Diagnosis not present

## 2022-08-15 DIAGNOSIS — Z6821 Body mass index (BMI) 21.0-21.9, adult: Secondary | ICD-10-CM | POA: Diagnosis not present

## 2022-08-15 DIAGNOSIS — Z113 Encounter for screening for infections with a predominantly sexual mode of transmission: Secondary | ICD-10-CM | POA: Diagnosis not present

## 2022-09-09 DIAGNOSIS — R012 Other cardiac sounds: Secondary | ICD-10-CM | POA: Diagnosis not present

## 2022-09-09 DIAGNOSIS — J029 Acute pharyngitis, unspecified: Secondary | ICD-10-CM | POA: Diagnosis not present

## 2022-09-09 DIAGNOSIS — U071 COVID-19: Secondary | ICD-10-CM | POA: Diagnosis not present

## 2022-10-08 DIAGNOSIS — N390 Urinary tract infection, site not specified: Secondary | ICD-10-CM | POA: Diagnosis not present

## 2022-10-08 DIAGNOSIS — N898 Other specified noninflammatory disorders of vagina: Secondary | ICD-10-CM | POA: Diagnosis not present

## 2022-10-08 DIAGNOSIS — Z113 Encounter for screening for infections with a predominantly sexual mode of transmission: Secondary | ICD-10-CM | POA: Diagnosis not present

## 2022-10-12 DIAGNOSIS — A6004 Herpesviral vulvovaginitis: Secondary | ICD-10-CM | POA: Diagnosis not present

## 2022-10-12 DIAGNOSIS — Z8659 Personal history of other mental and behavioral disorders: Secondary | ICD-10-CM | POA: Diagnosis not present

## 2022-10-12 DIAGNOSIS — Z79899 Other long term (current) drug therapy: Secondary | ICD-10-CM | POA: Diagnosis not present

## 2022-10-12 DIAGNOSIS — F17290 Nicotine dependence, other tobacco product, uncomplicated: Secondary | ICD-10-CM | POA: Diagnosis not present

## 2022-10-12 DIAGNOSIS — R102 Pelvic and perineal pain: Secondary | ICD-10-CM | POA: Diagnosis not present

## 2022-10-12 LAB — OB RESULTS CONSOLE GC/CHLAMYDIA: Chlamydia: NEGATIVE

## 2022-10-16 ENCOUNTER — Encounter: Payer: Self-pay | Admitting: Family Medicine

## 2022-10-16 ENCOUNTER — Ambulatory Visit: Payer: BC Managed Care – PPO | Admitting: Family Medicine

## 2022-10-16 VITALS — BP 99/56 | HR 105 | Temp 98.5°F | Ht 60.0 in | Wt 112.1 lb

## 2022-10-16 DIAGNOSIS — D72820 Lymphocytosis (symptomatic): Secondary | ICD-10-CM

## 2022-10-16 DIAGNOSIS — R590 Localized enlarged lymph nodes: Secondary | ICD-10-CM | POA: Diagnosis not present

## 2022-10-16 DIAGNOSIS — R197 Diarrhea, unspecified: Secondary | ICD-10-CM | POA: Diagnosis not present

## 2022-10-16 DIAGNOSIS — T50905A Adverse effect of unspecified drugs, medicaments and biological substances, initial encounter: Secondary | ICD-10-CM

## 2022-10-16 DIAGNOSIS — R748 Abnormal levels of other serum enzymes: Secondary | ICD-10-CM

## 2022-10-16 DIAGNOSIS — R110 Nausea: Secondary | ICD-10-CM | POA: Diagnosis not present

## 2022-10-16 DIAGNOSIS — R1013 Epigastric pain: Secondary | ICD-10-CM

## 2022-10-16 NOTE — Progress Notes (Signed)
Acute Office Visit  Subjective:     Patient ID: Sierra Mcdaniel, female    DOB: May 18, 2001, 22 y.o.   MRN: [id number]  Chief Complaint  Patient presents with   Follow-up    Ed follow up    HPI She thought she might have a yeast infection so went ot ED on 3/1.  Wet prep was negative.  GC chlamydia testing was negative.  Patient is in today for nausea, vomiting started after starting Flagyl.  Negative pregnancy test.  She was given Flagyl to take.  She did run a fever right before she went to the ED on the first.  She is just not sure if her symptoms are still related to the medication as a potential side effect or if it something else going on.  She did do a COVID test at home and it was negative.  No cold symptoms.  Just decreased appetite and significant nausea she is having some epigastric pain.  It is not necessarily worse when she eats or does not eat.  She would like to have her thyroid checked her mom has a history of Graves' disease diagnosed in her 50s.  She did have diarrhea for couple days as a side effect of the medication as well.  She was dx with COVID about 5 weeks ago.    ROS      Objective:    BP (!) 99/56   Pulse (!) 105   Temp 98.5 F (36.9 C) (Oral)   Ht 5' (1.524 m)   Wt 112 lb 1.9 oz (50.9 kg)   SpO2 98%   BMI 21.90 kg/m    Physical Exam Constitutional:      Appearance: She is well-developed.  HENT:     Head: Normocephalic and atraumatic.     Right Ear: External ear normal.     Left Ear: External ear normal.     Nose: Nose normal.  Eyes:     Conjunctiva/sclera: Conjunctivae normal.     Pupils: Pupils are equal, round, and reactive to light.  Neck:     Thyroid: No thyromegaly.  Cardiovascular:     Rate and Rhythm: Normal rate and regular rhythm.     Heart sounds: Normal heart sounds.  Pulmonary:     Effort: Pulmonary effort is normal.     Breath sounds: Normal breath sounds. No wheezing.  Musculoskeletal:     Cervical back: Neck supple.   Lymphadenopathy:     Cervical: Cervical adenopathy present.  Skin:    General: Skin is warm and dry.  Neurological:     Mental Status: She is alert and oriented to person, place, and time.     Results for orders placed or performed in visit on 10/16/22  OB RESULTS CONSOLE GC/Chlamydia  Result Value Ref Range   Chlamydia Negative         Assessment & Plan:   Problem List Items Addressed This Visit   None Visit Diagnoses     Medication side effect, initial encounter    -  Primary   Relevant Orders   CBC with Differential/Platelet   COMPLETE METABOLIC PANEL WITH GFR   Lipase   TSH   Nausea       Relevant Orders   CBC with Differential/Platelet   COMPLETE METABOLIC PANEL WITH GFR   Lipase   TSH   Diarrhea, unspecified type       Relevant Orders   CBC with Differential/Platelet   COMPLETE METABOLIC PANEL WITH  GFR   Lipase   TSH   Epigastric pain       Relevant Orders   CBC with Differential/Platelet   COMPLETE METABOLIC PANEL WITH GFR   Lipase   TSH   Cervical lymphadenopathy          Unclear etiology this certainly could be a medication side effect from the metronidazole she did stop it after 3 days but really does not feel a lot better.  But she is also having epigastric pain and some lymphadenopathy in the cervical area which is a little bit unusual.  Also consider viral illness.  So we will start by getting some additional blood work today.  Will check for elevated liver enzymes, lipase, abnormal thyroid, and will check a CBC with differential.  Encouraged her to stay hydrated is much as possible.  And call if not improving or develops new or worsening symptoms.  Half-life of the drug is about 8 hours.  Will fax request for most recent Pap smear.  No orders of the defined types were placed in this encounter.   Return if symptoms worsen or fail to improve.  Beatrice Lecher, MD

## 2022-10-17 DIAGNOSIS — R110 Nausea: Secondary | ICD-10-CM | POA: Diagnosis not present

## 2022-10-17 DIAGNOSIS — R197 Diarrhea, unspecified: Secondary | ICD-10-CM | POA: Diagnosis not present

## 2022-10-17 DIAGNOSIS — D72820 Lymphocytosis (symptomatic): Secondary | ICD-10-CM | POA: Diagnosis not present

## 2022-10-17 DIAGNOSIS — R1013 Epigastric pain: Secondary | ICD-10-CM | POA: Diagnosis not present

## 2022-10-17 LAB — CBC WITH DIFFERENTIAL/PLATELET
Absolute Monocytes: 426 cells/uL (ref 200–950)
Basophils Absolute: 229 cells/uL — ABNORMAL HIGH (ref 0–200)
Basophils Relative: 2.2 %
Eosinophils Absolute: 42 cells/uL (ref 15–500)
Eosinophils Relative: 0.4 %
HCT: 43.7 % (ref 35.0–45.0)
Hemoglobin: 14.7 g/dL (ref 11.7–15.5)
Lymphs Abs: 7779 cells/uL — ABNORMAL HIGH (ref 850–3900)
MCH: 29.9 pg (ref 27.0–33.0)
MCHC: 33.6 g/dL (ref 32.0–36.0)
MCV: 89 fL (ref 80.0–100.0)
MPV: 10.6 fL (ref 7.5–12.5)
Monocytes Relative: 4.1 %
Neutro Abs: 1924 cells/uL (ref 1500–7800)
Neutrophils Relative %: 18.5 %
Platelets: 183 10*3/uL (ref 140–400)
RBC: 4.91 10*6/uL (ref 3.80–5.10)
RDW: 12.2 % (ref 11.0–15.0)
Total Lymphocyte: 74.8 %
WBC: 10.4 10*3/uL (ref 3.8–10.8)

## 2022-10-17 LAB — TEST AUTHORIZATION

## 2022-10-17 LAB — COMPLETE METABOLIC PANEL WITH GFR
AG Ratio: 1.5 (calc) (ref 1.0–2.5)
ALT: 141 U/L — ABNORMAL HIGH (ref 6–29)
AST: 126 U/L — ABNORMAL HIGH (ref 10–30)
Albumin: 4.1 g/dL (ref 3.6–5.1)
Alkaline phosphatase (APISO): 187 U/L — ABNORMAL HIGH (ref 31–125)
BUN/Creatinine Ratio: 9 (calc) (ref 6–22)
BUN: 10 mg/dL (ref 7–25)
CO2: 26 mmol/L (ref 20–32)
Calcium: 9 mg/dL (ref 8.6–10.2)
Chloride: 105 mmol/L (ref 98–110)
Creat: 1.09 mg/dL — ABNORMAL HIGH (ref 0.50–0.96)
Globulin: 2.8 g/dL (calc) (ref 1.9–3.7)
Glucose, Bld: 104 mg/dL — ABNORMAL HIGH (ref 65–99)
Potassium: 4.1 mmol/L (ref 3.5–5.3)
Sodium: 141 mmol/L (ref 135–146)
Total Bilirubin: 0.7 mg/dL (ref 0.2–1.2)
Total Protein: 6.9 g/dL (ref 6.1–8.1)
eGFR: 73 mL/min/{1.73_m2} (ref >=60)

## 2022-10-17 LAB — HEPATITIS PANEL, ACUTE
Hep A IgM: NONREACTIVE
Hep B C IgM: NONREACTIVE
Hepatitis B Surface Ag: NONREACTIVE
Hepatitis C Ab: NONREACTIVE

## 2022-10-17 LAB — TSH: TSH: 0.98 mIU/L

## 2022-10-17 LAB — LIPASE: Lipase: 20 U/L (ref 7–60)

## 2022-10-17 NOTE — Addendum Note (Signed)
Addended by: Beatrice Lecher D on: 10/17/2022 07:56 AM   Modules accepted: Orders

## 2022-10-17 NOTE — Progress Notes (Signed)
In addition to note below. Please call lab and see if we can add an acute hepatitis panel.

## 2022-10-17 NOTE — Progress Notes (Signed)
Hi Sierra Mcdaniel, Your lymphocytes which is a type of white blood cell are elevated as well as your liver enzymes.  I would like to test you for EBV and CMV which are viruses related to mono.  Would you be willing to come back to the lab today to have that drawn.  I also would like to recheck your liver enzymes to make sure that they are starting to trend back down and not upward.  Please avoid any Tylenol or alcohol products for now.  Make sure you are hydrating.

## 2022-10-19 NOTE — Progress Notes (Signed)
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  The common term is mono.  I think this is why you have felt so bad.  Unfortunately it can take several weeks to rebound from mono.  You can feel tired and exhausted.  We do not recommend any really intense exercise or sports.  Though you can do things like walking etc.  We do want to recheck your liver enzymes in about a week to make sure that they are continuing to trend down.  Work on staying hydrated.  Okay to use Tylenol or Motrin as needed for fever and pain relief.  This most likely explains the swollen lymph nodes in your neck as well.

## 2022-10-21 LAB — HEPATIC FUNCTION PANEL
AG Ratio: 1.6 (calc) (ref 1.0–2.5)
ALT: 154 U/L — ABNORMAL HIGH (ref 6–29)
AST: 100 U/L — ABNORMAL HIGH (ref 10–30)
Albumin: 4.3 g/dL (ref 3.6–5.1)
Alkaline phosphatase (APISO): 206 U/L — ABNORMAL HIGH (ref 31–125)
Bilirubin, Direct: 0.2 mg/dL (ref 0.0–0.2)
Globulin: 2.7 g/dL (calc) (ref 1.9–3.7)
Indirect Bilirubin: 0.6 mg/dL (calc) (ref 0.2–1.2)
Total Bilirubin: 0.8 mg/dL (ref 0.2–1.2)
Total Protein: 7 g/dL (ref 6.1–8.1)

## 2022-10-21 LAB — PATHOLOGIST SMEAR REVIEW

## 2022-10-21 LAB — EPSTEIN-BARR VIRUS EARLY D ANTIGEN ANTIBODY, IGG: EBV EA IgG: 11.3 U/mL — ABNORMAL HIGH (ref <9.00)

## 2022-10-21 LAB — CMV ABS, IGG+IGM (CYTOMEGALOVIRUS)
CMV IgM: <30 AU/mL
Cytomegalovirus Ab-IgG: 6.5 U/mL — ABNORMAL HIGH

## 2022-10-21 LAB — EPSTEIN-BARR VIRUS VCA, IGM: EBV VCA IgM: >160 U/mL — ABNORMAL HIGH

## 2022-10-22 NOTE — Progress Notes (Signed)
Okay to give work note to be out until the 16th.

## 2022-10-23 ENCOUNTER — Telehealth: Payer: Self-pay

## 2022-10-23 NOTE — Telephone Encounter (Signed)
None needed

## 2022-10-30 ENCOUNTER — Encounter: Payer: Self-pay | Admitting: Physician Assistant

## 2022-11-02 ENCOUNTER — Other Ambulatory Visit: Payer: Self-pay

## 2022-11-02 DIAGNOSIS — R748 Abnormal levels of other serum enzymes: Secondary | ICD-10-CM

## 2022-11-03 LAB — COMPLETE METABOLIC PANEL WITH GFR
AG Ratio: 1.7 (calc) (ref 1.0–2.5)
ALT: 32 U/L — ABNORMAL HIGH (ref 6–29)
AST: 22 U/L (ref 10–30)
Albumin: 4.3 g/dL (ref 3.6–5.1)
Alkaline phosphatase (APISO): 88 U/L (ref 31–125)
BUN: 9 mg/dL (ref 7–25)
CO2: 27 mmol/L (ref 20–32)
Calcium: 9.2 mg/dL (ref 8.6–10.2)
Chloride: 105 mmol/L (ref 98–110)
Creat: 0.74 mg/dL (ref 0.50–0.96)
Globulin: 2.5 g/dL (calc) (ref 1.9–3.7)
Glucose, Bld: 111 mg/dL — ABNORMAL HIGH (ref 65–99)
Potassium: 4.2 mmol/L (ref 3.5–5.3)
Sodium: 139 mmol/L (ref 135–146)
Total Bilirubin: 1 mg/dL (ref 0.2–1.2)
Total Protein: 6.8 g/dL (ref 6.1–8.1)
eGFR: 117 mL/min/{1.73_m2} (ref >=60)

## 2022-11-05 NOTE — Progress Notes (Signed)
Kidney function MUCH better!  Liver enzymes MUCH better almost completely back in normal range. You can continue to feel fatigued from mono for weeks even after labs normalize.

## 2022-11-26 DIAGNOSIS — N760 Acute vaginitis: Secondary | ICD-10-CM | POA: Diagnosis not present

## 2022-11-26 DIAGNOSIS — Z113 Encounter for screening for infections with a predominantly sexual mode of transmission: Secondary | ICD-10-CM | POA: Diagnosis not present

## 2022-11-29 DIAGNOSIS — N760 Acute vaginitis: Secondary | ICD-10-CM | POA: Diagnosis not present

## 2022-12-24 DIAGNOSIS — B9689 Other specified bacterial agents as the cause of diseases classified elsewhere: Secondary | ICD-10-CM | POA: Diagnosis not present

## 2022-12-24 DIAGNOSIS — J020 Streptococcal pharyngitis: Secondary | ICD-10-CM | POA: Diagnosis not present

## 2023-02-07 DIAGNOSIS — N898 Other specified noninflammatory disorders of vagina: Secondary | ICD-10-CM | POA: Diagnosis not present

## 2023-02-07 DIAGNOSIS — Z309 Encounter for contraceptive management, unspecified: Secondary | ICD-10-CM | POA: Diagnosis not present

## 2023-03-01 DIAGNOSIS — N941 Unspecified dyspareunia: Secondary | ICD-10-CM | POA: Diagnosis not present

## 2023-04-12 DIAGNOSIS — N760 Acute vaginitis: Secondary | ICD-10-CM | POA: Diagnosis not present

## 2023-04-12 DIAGNOSIS — Z1159 Encounter for screening for other viral diseases: Secondary | ICD-10-CM | POA: Diagnosis not present

## 2023-04-16 DIAGNOSIS — Z30432 Encounter for removal of intrauterine contraceptive device: Secondary | ICD-10-CM | POA: Diagnosis not present

## 2023-04-17 DIAGNOSIS — Z30013 Encounter for initial prescription of injectable contraceptive: Secondary | ICD-10-CM | POA: Diagnosis not present

## 2023-05-03 DIAGNOSIS — R309 Painful micturition, unspecified: Secondary | ICD-10-CM | POA: Diagnosis not present

## 2023-05-03 DIAGNOSIS — N941 Unspecified dyspareunia: Secondary | ICD-10-CM | POA: Diagnosis not present

## 2023-05-06 DIAGNOSIS — L02212 Cutaneous abscess of back [any part, except buttock]: Secondary | ICD-10-CM | POA: Diagnosis not present

## 2023-06-05 DIAGNOSIS — R059 Cough, unspecified: Secondary | ICD-10-CM | POA: Diagnosis not present

## 2023-06-05 DIAGNOSIS — J069 Acute upper respiratory infection, unspecified: Secondary | ICD-10-CM | POA: Diagnosis not present

## 2023-06-05 DIAGNOSIS — H6691 Otitis media, unspecified, right ear: Secondary | ICD-10-CM | POA: Diagnosis not present

## 2023-06-05 DIAGNOSIS — J029 Acute pharyngitis, unspecified: Secondary | ICD-10-CM | POA: Diagnosis not present

## 2023-07-08 DIAGNOSIS — Z309 Encounter for contraceptive management, unspecified: Secondary | ICD-10-CM | POA: Diagnosis not present

## 2023-07-08 DIAGNOSIS — B009 Herpesviral infection, unspecified: Secondary | ICD-10-CM | POA: Diagnosis not present

## 2023-07-08 DIAGNOSIS — N390 Urinary tract infection, site not specified: Secondary | ICD-10-CM | POA: Diagnosis not present

## 2023-08-12 DIAGNOSIS — R8290 Unspecified abnormal findings in urine: Secondary | ICD-10-CM | POA: Diagnosis not present

## 2023-08-12 DIAGNOSIS — N760 Acute vaginitis: Secondary | ICD-10-CM | POA: Diagnosis not present

## 2023-08-12 DIAGNOSIS — Z113 Encounter for screening for infections with a predominantly sexual mode of transmission: Secondary | ICD-10-CM | POA: Diagnosis not present

## 2023-08-16 ENCOUNTER — Ambulatory Visit: Payer: BC Managed Care – PPO | Admitting: Physician Assistant

## 2023-08-20 ENCOUNTER — Ambulatory Visit: Payer: BC Managed Care – PPO | Admitting: Physician Assistant

## 2023-08-26 ENCOUNTER — Ambulatory Visit: Payer: BC Managed Care – PPO | Admitting: Physician Assistant

## 2023-08-26 ENCOUNTER — Encounter: Payer: Self-pay | Admitting: Physician Assistant

## 2023-08-26 VITALS — BP 129/77 | HR 99 | Ht 60.0 in | Wt 126.0 lb

## 2023-08-26 DIAGNOSIS — R635 Abnormal weight gain: Secondary | ICD-10-CM

## 2023-08-26 DIAGNOSIS — L659 Nonscarring hair loss, unspecified: Secondary | ICD-10-CM | POA: Diagnosis not present

## 2023-08-26 DIAGNOSIS — R5382 Chronic fatigue, unspecified: Secondary | ICD-10-CM

## 2023-08-26 DIAGNOSIS — Z3009 Encounter for other general counseling and advice on contraception: Secondary | ICD-10-CM | POA: Diagnosis not present

## 2023-08-26 DIAGNOSIS — E559 Vitamin D deficiency, unspecified: Secondary | ICD-10-CM | POA: Diagnosis not present

## 2023-08-26 MED ORDER — NORETHIN ACE-ETH ESTRAD-FE 1-20 MG-MCG(24) PO TABS: 1.0000 | ORAL_TABLET | Freq: Every day | ORAL | 3 refills | Status: AC

## 2023-08-26 NOTE — Patient Instructions (Addendum)
 Stop depo and start pill on day next shot due.  Get labs today.   Oral Contraception Information Oral contraceptive pills (OCPs) are medicines taken by mouth to prevent pregnancy. They work by: Preventing the ovaries from releasing eggs. Thickening mucus in the lower part of the uterus (cervix). This prevents sperm from entering the uterus. Thinning the lining of the uterus (endometrium). This prevents a fertilized egg from attaching to the endometrium. OCPs are highly effective when taken exactly as prescribed. However, OCPs do not prevent STIs (sexually transmitted infections). Using condoms while on an OCP can help prevent STIs. What happens before starting OCPs? Before you start taking OCPs: You may have a physical exam, blood test, and Pap test. Your health care provider will make sure you are a good candidate for oral contraception. OCPs are not a good option for certain women, such as: Women who smoke and are older than age 50. Women who have or have had certain conditions, such as: A history of high blood pressure. Deep vein thrombosis. Pulmonary embolism. Stroke. Cardiovascular disease. Peripheral vascular disease. Ask your health care provider about the possible side effects of the OCP you may be prescribed. Be aware that it can take 2-3 months for your body to adjust to changes in hormone levels. Types of oral contraception  Birth control pills contain the hormones estrogen and progestin (synthetic progesterone) or progestin only. The combination pill This type of pill contains estrogen and progestin hormones. Conventional contraception pills come in packs of 21 or 28 pills. Some packs with 28-day pills contain estrogen and progestin for the first 21-24 days. Hormone-free tablets, called placebos, are taken for the final 4-7 days. You should have menstrual bleeding during the time you take the placebos. In packs with 21 tablets, you take no pills for 7 days. Menstrual bleeding  occurs during these days. (Some people prefer taking a pill for 28 days to help establish a routine). Extended-interval contraception pills come in packs of 91 pills. The first 84 tablets have both estrogen and progestin. The last 7 pills are placebos. Menstrual bleeding occurs during the placebo days. With this schedule, menstrual bleeding happens once every 3 months. Continuous contraception pills come in packs of 28 pills. All pills in the pack contain estrogen and progestin. With this schedule, regular menstrual bleeding does not happen, but there may be spotting or irregular bleeding. Progestin-only pills This type of pill is often called the mini-pill and contains the progestin hormone only. It comes in packs of 28 pills. In some packs, the last 4 pills are placebos. The pill must be taken at the same time every day. This is very important to prevent pregnancy. Menstrual bleeding may not be regular or predictable. What are the advantages? Oral contraception provides reliable and continuous contraception if taken as directed. It may treat or decrease symptoms of: Menstrual period cramps. Irregular menstrual cycle or bleeding. Heavy menstrual flow. Abnormal uterine bleeding. Acne, depending on the type of pill. Polycystic ovarian syndrome (POS). Endometriosis. Iron deficiency anemia. Premenstrual symptoms, including severe irritability, depression, or anxiety. It also may: Reduce the risk of endometrial and ovarian cancer. Be used as emergency contraception. Prevent ectopic pregnancies and infections of the fallopian tubes. What can make OCPs less effective? OCPs may be less effective if: You forget to take the pill every day. For progestin-only pills, it is especially important to take the pill at the same time each day. Even taking it 3 hours late can increase the risk of pregnancy.  You have a stomach or intestinal disease that reduces your body's ability to absorb the pill. You take  OCPs with other medicines that make OCPs less effective, such as antibiotics, certain HIV medicines, and some seizure medicines. You take expired OCPs. You forget to restart the pill after 7 days of not taking it. This refers to the packs of 21 pills. What are the side effects and risks? OCPs can sometimes cause side effects, such as: Headache. Depression. Trouble sleeping. Nausea and vomiting. Breast tenderness. Irregular bleeding or spotting during the first several months. Bloating or fluid retention. Increase in blood pressure. Combination pills may slightly increase the risk of: Blood clots. Heart attack. Stroke. Follow these instructions at home: Follow instructions from your health care provider about how to start taking your first cycle of OCPs. Depending on when you start the pill, you may need to use a backup form of birth control, such as condoms, during the first week. Make sure you know what steps to take if you forget to take the pill. Summary Oral contraceptive pills (OCPs) are medicines taken by mouth to prevent pregnancy. They are highly effective when taken exactly as prescribed. OCPs contain a combination of the hormones estrogen and progestin (synthetic progesterone) or progestin only. Before you start taking the pill, you may have a physical exam, blood test, and Pap test. Your health care provider will make sure you are a good candidate for oral contraception. The combination pill may come in a 21-day pack, a 28-day pack, or a 91-day pack. Progestin-only pills come in packs of 28 pills. OCPs can sometimes cause side effects, such as headache, nausea, breast tenderness, or irregular bleeding. This information is not intended to replace advice given to you by your health care provider. Make sure you discuss any questions you have with your health care provider. Document Revised: 04/29/2020 Document Reviewed: 04/07/2020 Elsevier Patient Education  2024 [employer].

## 2023-08-26 NOTE — Progress Notes (Signed)
 Established Patient Office Visit  Subjective   Patient ID: Sierra Mcdaniel, female    DOB: May 24, 2001  Age: 23 y.o. MRN: [id number]  Chief Complaint  Patient presents with   Medical Management of Chronic Issues    Thyroid / hormone check    HPI  Pt is a 23 yo female coming in to the office for complaints of weight gain, hair loss and chronic fatigue. She states this has been going on for the last year and a half since she switched her birth control from Mirena  to the depo shot. Since being on the depo shot her weight has increased from 105lbs to 130lbs.   She also states that she has noticed significant hair loss in the last year and a half since being on the depo shot. She does not think it is d/t to stress or sickness. The hair loss has not fluctuated but has remained consistent over this time period.   The pt is also complaining of chronic fatigue, but she denies having difficulty sleeping at night. She is concerned that it may be d/t a vitamin deficiency or something with her thyroid  because her mother has Graves' disease.   Review of Systems  Constitutional:  Positive for malaise/fatigue. Negative for chills and fever.  Cardiovascular:  Negative for chest pain and palpitations.  Gastrointestinal:  Negative for abdominal pain, constipation, nausea and vomiting.  Neurological:  Negative for dizziness and headaches.      Objective:     BP 129/77   Pulse 99   Ht 5' (1.524 m)   Wt 126 lb (57.2 kg)   SpO2 99%   BMI 24.61 kg/m  BP Readings from Last 3 Encounters:  08/26/23 129/77  10/16/22 (!) 99/56  01/19/22 (!) 101/58   Wt Readings from Last 3 Encounters:  08/26/23 126 lb (57.2 kg)  10/16/22 112 lb 1.9 oz (50.9 kg)  01/19/22 113 lb (51.3 kg)      Physical Exam Constitutional:      Appearance: Normal appearance.  HENT:     Head: Normocephalic.  Cardiovascular:     Rate and Rhythm: Normal rate and regular rhythm.     Pulses: Normal pulses.     Heart sounds:  Normal heart sounds.  Pulmonary:     Effort: Pulmonary effort is normal.     Breath sounds: Normal breath sounds.  Musculoskeletal:     Cervical back: Neck supple. No tenderness.  Skin:    General: Skin is warm and dry.  Neurological:     Mental Status: She is alert and oriented to person, place, and time.  Psychiatric:        Mood and Affect: Mood normal.        Behavior: Behavior normal.        Judgment: Judgment normal.           Assessment & Plan:   SABRASABRAGenny was seen today for medical management of chronic issues.  Diagnoses and all orders for this visit:  Hair loss -     TSH + free T4 -     CMP14+EGFR -     CBC w/Diff/Platelet -     Fe+TIBC+Fer -     VITAMIN D  25 Hydroxy (Vit-D Deficiency, Fractures) -     B12 and Folate Panel  Abnormal weight gain -     TSH + free T4 -     CMP14+EGFR -     CBC w/Diff/Platelet -     Fe+TIBC+Fer -  VITAMIN D  25 Hydroxy (Vit-D Deficiency, Fractures) -     B12 and Folate Panel  Birth control counseling -     TSH + free T4 -     CMP14+EGFR -     CBC w/Diff/Platelet -     Fe+TIBC+Fer -     VITAMIN D  25 Hydroxy (Vit-D Deficiency, Fractures) -     B12 and Folate Panel -     Norethindrone Acetate-Ethinyl Estrad-FE (LOESTRIN 24 FE) 1-20 MG-MCG(24) tablet; Take 1 tablet by mouth daily.  Chronic fatigue   Discussed possible side effects of Depo-provera  such as weight gain, hair loss and fatigue. Discussed other options for birth control. Start Loestrin 24 FE oral birth control on day that depo shot is due this month. Labs drawn to check for potential causes of fatigue such as vitamin deficiencies, anemia, or hyper/hypothyorid  Return in about 1 year (around 08/25/2024), or if symptoms worsen or fail to improve.    Dimitri Dsouza, PA-C

## 2023-08-27 ENCOUNTER — Encounter: Payer: Self-pay | Admitting: Physician Assistant

## 2023-08-27 DIAGNOSIS — R635 Abnormal weight gain: Secondary | ICD-10-CM | POA: Insufficient documentation

## 2023-08-27 DIAGNOSIS — Z3009 Encounter for other general counseling and advice on contraception: Secondary | ICD-10-CM | POA: Insufficient documentation

## 2023-08-27 DIAGNOSIS — L659 Nonscarring hair loss, unspecified: Secondary | ICD-10-CM | POA: Insufficient documentation

## 2023-08-27 NOTE — Progress Notes (Signed)
 Sierra Mcdaniel,   Vitamin D low. Make sure taking 1000-2000 units a day with dairy for better absorption.  Thyroid looks great.  Kidney, liver, glucose look great.  Normal hemoglobin.  B12 pending.

## 2023-08-27 NOTE — Progress Notes (Signed)
 B12 on the lower side of normal. Start taking daily!

## 2023-08-28 LAB — CMP14+EGFR
ALT: 9 [IU]/L (ref 0–32)
AST: 16 [IU]/L (ref 0–40)
Albumin: 4.5 g/dL (ref 4.0–5.0)
Alkaline Phosphatase: 54 [IU]/L (ref 44–121)
BUN/Creatinine Ratio: 12 (ref 9–23)
BUN: 10 mg/dL (ref 6–20)
Bilirubin Total: 0.6 mg/dL (ref 0.0–1.2)
CO2: 21 mmol/L (ref 20–29)
Calcium: 9.1 mg/dL (ref 8.7–10.2)
Chloride: 104 mmol/L (ref 96–106)
Creatinine, Ser: 0.81 mg/dL (ref 0.57–1.00)
Globulin, Total: 2.4 g/dL (ref 1.5–4.5)
Glucose: 76 mg/dL (ref 70–99)
Potassium: 3.7 mmol/L (ref 3.5–5.2)
Sodium: 139 mmol/L (ref 134–144)
Total Protein: 6.9 g/dL (ref 6.0–8.5)
eGFR: 104 mL/min/{1.73_m2} (ref >59)

## 2023-08-28 LAB — IRON,TIBC AND FERRITIN PANEL
Ferritin: 66 ng/mL (ref 15–150)
Iron Saturation: 37 % (ref 15–55)
Iron: 122 ug/dL (ref 27–159)
Total Iron Binding Capacity: 330 ug/dL (ref 250–450)
UIBC: 208 ug/dL (ref 131–425)

## 2023-08-28 LAB — TSH+FREE T4
Free T4: 1.23 ng/dL (ref 0.82–1.77)
TSH: 1.12 u[IU]/mL (ref 0.450–4.500)

## 2023-08-28 LAB — CBC WITH DIFFERENTIAL/PLATELET
Basophils Absolute: 0 10*3/uL (ref 0.0–0.2)
Basos: 1 %
EOS (ABSOLUTE): 0.2 10*3/uL (ref 0.0–0.4)
Eos: 3 %
Hematocrit: 42 % (ref 34.0–46.6)
Hemoglobin: 14 g/dL (ref 11.1–15.9)
Immature Grans (Abs): 0 10*3/uL (ref 0.0–0.1)
Immature Granulocytes: 0 %
Lymphocytes Absolute: 2.6 10*3/uL (ref 0.7–3.1)
Lymphs: 45 %
MCH: 29.9 pg (ref 26.6–33.0)
MCHC: 33.3 g/dL (ref 31.5–35.7)
MCV: 90 fL (ref 79–97)
Monocytes Absolute: 0.3 10*3/uL (ref 0.1–0.9)
Monocytes: 5 %
Neutrophils Absolute: 2.6 10*3/uL (ref 1.4–7.0)
Neutrophils: 46 %
Platelets: 243 10*3/uL (ref 150–450)
RBC: 4.68 x10E6/uL (ref 3.77–5.28)
RDW: 12 % (ref 11.7–15.4)
WBC: 5.7 10*3/uL (ref 3.4–10.8)

## 2023-08-28 LAB — VITAMIN D 25 HYDROXY (VIT D DEFICIENCY, FRACTURES): Vit D, 25-Hydroxy: 24.5 ng/mL — ABNORMAL LOW (ref 30.0–100.0)

## 2023-08-28 LAB — B12 AND FOLATE PANEL
Folate: 11.1 ng/mL (ref >3.0)
Vitamin B-12: 267 pg/mL (ref 232–1245)

## 2023-09-24 DIAGNOSIS — Z113 Encounter for screening for infections with a predominantly sexual mode of transmission: Secondary | ICD-10-CM | POA: Diagnosis not present

## 2023-09-24 DIAGNOSIS — N760 Acute vaginitis: Secondary | ICD-10-CM | POA: Diagnosis not present

## 2023-11-20 DIAGNOSIS — N760 Acute vaginitis: Secondary | ICD-10-CM | POA: Diagnosis not present

## 2023-12-19 DIAGNOSIS — R8290 Unspecified abnormal findings in urine: Secondary | ICD-10-CM | POA: Diagnosis not present

## 2023-12-19 DIAGNOSIS — N760 Acute vaginitis: Secondary | ICD-10-CM | POA: Diagnosis not present

## 2024-04-09 DIAGNOSIS — Z1151 Encounter for screening for human papillomavirus (HPV): Secondary | ICD-10-CM | POA: Diagnosis not present

## 2024-04-09 DIAGNOSIS — Z124 Encounter for screening for malignant neoplasm of cervix: Secondary | ICD-10-CM | POA: Diagnosis not present

## 2024-04-09 DIAGNOSIS — N760 Acute vaginitis: Secondary | ICD-10-CM | POA: Diagnosis not present

## 2024-04-09 DIAGNOSIS — Z01419 Encounter for gynecological examination (general) (routine) without abnormal findings: Secondary | ICD-10-CM | POA: Diagnosis not present

## 2024-04-09 DIAGNOSIS — Z113 Encounter for screening for infections with a predominantly sexual mode of transmission: Secondary | ICD-10-CM | POA: Diagnosis not present

## 2024-04-09 DIAGNOSIS — Z6824 Body mass index (BMI) 24.0-24.9, adult: Secondary | ICD-10-CM | POA: Diagnosis not present

## 2024-05-12 DIAGNOSIS — J069 Acute upper respiratory infection, unspecified: Secondary | ICD-10-CM | POA: Diagnosis not present

## 2024-05-12 DIAGNOSIS — R5381 Other malaise: Secondary | ICD-10-CM | POA: Diagnosis not present

## 2024-05-12 DIAGNOSIS — J029 Acute pharyngitis, unspecified: Secondary | ICD-10-CM | POA: Diagnosis not present

## 2024-06-29 DIAGNOSIS — N760 Acute vaginitis: Secondary | ICD-10-CM | POA: Diagnosis not present

## 2024-06-29 DIAGNOSIS — N911 Secondary amenorrhea: Secondary | ICD-10-CM | POA: Diagnosis not present

## 2024-07-01 DIAGNOSIS — O021 Missed abortion: Secondary | ICD-10-CM | POA: Diagnosis not present

## 2024-07-16 DIAGNOSIS — O021 Missed abortion: Secondary | ICD-10-CM | POA: Diagnosis not present
